# Patient Record
Sex: Male | Born: 1985
Health system: Southern US, Community
[De-identification: ages and names within clinical notes are randomized; demographics above are authoritative.]

## PROBLEM LIST (undated history)

## (undated) DIAGNOSIS — T7840XA Allergy, unspecified, initial encounter: Secondary | ICD-10-CM

## (undated) HISTORY — DX: Allergy, unspecified, initial encounter: T78.40XA

---
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
## 2009-10-19 ENCOUNTER — Ambulatory Visit: Payer: Self-pay | Admitting: Otolaryngology

## 2015-06-05 ENCOUNTER — Ambulatory Visit
Admission: RE | Admit: 2015-06-05 | Discharge: 2015-06-05 | Disposition: A | Discharge status: Home / Self Care | Payer: 59 | Source: Ambulatory Visit | Attending: Physician Assistant | Admitting: Physician Assistant

## 2015-06-05 ENCOUNTER — Ambulatory Visit (INDEPENDENT_AMBULATORY_CARE_PROVIDER_SITE_OTHER): Payer: 59 | Admitting: Physician Assistant

## 2015-06-05 ENCOUNTER — Encounter: Payer: Self-pay | Admitting: Physician Assistant

## 2015-06-05 ENCOUNTER — Telehealth: Payer: Self-pay

## 2015-06-05 VITALS — BP 102/62 | HR 72 | Temp 97.8°F | Resp 16 | Ht 73.0 in | Wt 156.4 lb

## 2015-06-05 DIAGNOSIS — S93402A Sprain of unspecified ligament of left ankle, initial encounter: Secondary | ICD-10-CM | POA: Insufficient documentation

## 2015-06-05 DIAGNOSIS — Z7189 Other specified counseling: Secondary | ICD-10-CM | POA: Diagnosis not present

## 2015-06-05 DIAGNOSIS — Y9367 Activity, basketball: Secondary | ICD-10-CM | POA: Insufficient documentation

## 2015-06-05 DIAGNOSIS — M25572 Pain in left ankle and joints of left foot: Secondary | ICD-10-CM | POA: Insufficient documentation

## 2015-06-05 DIAGNOSIS — X58XXXA Exposure to other specified factors, initial encounter: Secondary | ICD-10-CM | POA: Insufficient documentation

## 2015-06-05 DIAGNOSIS — Z7689 Persons encountering health services in other specified circumstances: Secondary | ICD-10-CM

## 2015-06-05 NOTE — Progress Notes (Signed)
Patient: Chris Sanders Male    DOB: 11/27/1985   30 y.o.   MRN: [id number] Visit Date: 06/05/2015  Today's Provider: Margaretann Loveless, PA-C   Chief Complaint  Patient presents with  . New Patient (Initial Visit)  . Ankle Injury   Subjective:    HPI Chris Sanders is here today as a new patient and c/o of ankle sprain. Ankle Pain: Patient complains of injury to the left ankle. This is evaluated as a personal injury. The injury occurred 1 day ago, and occurred while playing basketball.  The patient states the ankle rolled forward and inward in an inversion ankle injury mechanism.  He did not hear or sense a pop or snap at the time of the injury. The patient notes pain and severe swelling of the ankle since the injury. He has treated the ankle with ice once. He has sprained this ankle in the past. The Left ankle is swollen over the lateral aspect.  He cannot bear weight currently and is on crutches.      Allergies  Allergen Reactions  . Bactrim [Sulfamethoxazole-Trimethoprim]   . Sulfur    Previous Medications   No medications on file    Review of Systems  Constitutional: Negative.   HENT: Negative.   Eyes: Negative.   Respiratory: Negative.   Cardiovascular: Negative.   Endocrine: Negative.   Genitourinary: Negative.   Musculoskeletal: Positive for joint swelling (left ankle), arthralgias (left ankle) and gait problem (unable to bear weigt on left ankle currently).       Ankle Injury  Skin: Negative.   Allergic/Immunologic: Negative.   Neurological: Negative.   Hematological: Negative.   Psychiatric/Behavioral: Negative.     Social History  Substance Use Topics  . Smoking status: Current Every Day Smoker -- 0.25 packs/day    Types: Cigarettes  . Smokeless tobacco: Not on file  . Alcohol Use: Yes     Comment: 3 drinks weekly   Objective:   BP 102/62 mmHg  Pulse 72  Temp(Src) 97.8 F (36.6 C) (Oral)  Resp 16  Ht  (1.854 m)  Wt 156 lb 6.4 oz  (70.943 kg)  BMI 20.64 kg/m2  Physical Exam  Constitutional: He appears well-developed and well-nourished. No distress.  HENT:  Head: Normocephalic and atraumatic.  Cardiovascular: Normal rate, regular rhythm and normal heart sounds.  Exam reveals no gallop and no friction rub.   No murmur heard. Pulmonary/Chest: Effort normal and breath sounds normal. No respiratory distress. He has no wheezes. He has no rales.  Musculoskeletal:       Right ankle: Normal.       Left ankle: He exhibits decreased range of motion and swelling. He exhibits no ecchymosis, no deformity, no laceration and normal pulse. Tenderness. Lateral malleolus, AITFL, CF ligament and posterior TFL (mildly tender) tenderness found. No medial malleolus, no head of 5th metatarsal and no proximal fibula tenderness found. Achilles tendon normal.  Positive anterior drawer; negative talar tilt (could be false negative secondary to swelling)  Skin: He is not diaphoretic.  Vitals reviewed.       Assessment & Plan:     1. Ankle sprain, left, initial encounter Applied ace bandage to left ankle today.  Advised rest, ice, compression and elevation. May use ibuprofen or aleve for pain and inflammation. May continue crutches for now.  Will obtain xray to r/o fibular fracture/lateral malleolus fracture. No tenderness over the base of the 5th metatarsal so I do not  expect an avulsion fracture there.  Will f/u pending xray results. - DG Ankle Complete Left; Future  2. Establishing care with new doctor, encounter for No previous PCP.       Margaretann Loveless, PA-C  [facility]

## 2015-06-05 NOTE — Telephone Encounter (Signed)
-----   Message from Margaretann Loveless, PA-C sent at 06/05/2015 10:57 AM EST ----- No acute fracture noted on xray.  Soft tissue swelling consistent with grade 2 ankle sprain.  Continue RICE and non-weight bearing with crutches x 1-2 days.  Start trying to partially bear weight with assistance from crutch over the next few days. May benefit from ankle brace for stability. It may take anywhere from 2-8 weeks for full recovery. If unable to bear weight greater than 1-2 weeks please call the office. May need repeat xray secondary to swelling or referral to PT for rehab.

## 2015-06-05 NOTE — Patient Instructions (Signed)
Ankle Sprain  An ankle sprain is an injury to the strong, fibrous tissues (ligaments) that hold the bones of your ankle joint together.   CAUSES  An ankle sprain is usually caused by a fall or by twisting your ankle. Ankle sprains most commonly occur when you step on the outer edge of your foot, and your ankle turns inward. People who participate in sports are more prone to these types of injuries.   SYMPTOMS    Pain in your ankle. The pain may be present at rest or only when you are trying to stand or walk.   Swelling.   Bruising. Bruising may develop immediately or within 1 to 2 days after your injury.   Difficulty standing or walking, particularly when turning corners or changing directions.  DIAGNOSIS   Your caregiver will ask you details about your injury and perform a physical exam of your ankle to determine if you have an ankle sprain. During the physical exam, your caregiver will press on and apply pressure to specific areas of your foot and ankle. Your caregiver will try to move your ankle in certain ways. An X-ray exam may be done to be sure a bone was not broken or a ligament did not separate from one of the bones in your ankle (avulsion fracture).   TREATMENT   Certain types of braces can help stabilize your ankle. Your caregiver can make a recommendation for this. Your caregiver may recommend the use of medicine for pain. If your sprain is severe, your caregiver may refer you to a surgeon who helps to restore function to parts of your skeletal system (orthopedist) or a physical therapist.  HOME CARE INSTRUCTIONS    Apply ice to your injury for 1-2 days or as directed by your caregiver. Applying ice helps to reduce inflammation and pain.    Put ice in a plastic bag.    Place a towel between your skin and the bag.    Leave the ice on for 15-20 minutes at a time, every 2 hours while you are awake.   Only take over-the-counter or prescription medicines for pain, discomfort, or fever as directed by  your caregiver.   Elevate your injured ankle above the level of your heart as much as possible for 2-3 days.   If your caregiver recommends crutches, use them as instructed. Gradually put weight on the affected ankle. Continue to use crutches or a cane until you can walk without feeling pain in your ankle.   If you have a plaster splint, wear the splint as directed by your caregiver. Do not rest it on anything harder than a pillow for the first 24 hours. Do not put weight on it. Do not get it wet. You may take it off to take a shower or bath.   You may have been given an elastic bandage to wear around your ankle to provide support. If the elastic bandage is too tight (you have numbness or tingling in your foot or your foot becomes cold and blue), adjust the bandage to make it comfortable.   If you have an air splint, you may blow more air into it or let air out to make it more comfortable. You may take your splint off at night and before taking a shower or bath. Wiggle your toes in the splint several times per day to decrease swelling.  SEEK MEDICAL CARE IF:    You have rapidly increasing bruising or swelling.   Your toes feel   extremely cold or you lose feeling in your foot.   Your pain is not relieved with medicine.  SEEK IMMEDIATE MEDICAL CARE IF:   Your toes are numb or blue.   You have severe pain that is increasing.  MAKE SURE YOU:    Understand these instructions.   Will watch your condition.   Will get help right away if you are not doing well or get worse.     This information is not intended to replace advice given to you by your health care provider. Make sure you discuss any questions you have with your health care provider.     Document Released: 03/25/2005 Document Revised: 04/15/2014 Document Reviewed: 04/06/2011  Elsevier Interactive Patient Education 2016 Elsevier Inc.

## 2015-06-05 NOTE — Telephone Encounter (Signed)
Marvis's wife advised as directed below. She is on the emergency contact.  Thanks,  -Alexcis Bicking

## 2015-06-29 ENCOUNTER — Ambulatory Visit (INDEPENDENT_AMBULATORY_CARE_PROVIDER_SITE_OTHER): Payer: 59 | Admitting: Physician Assistant

## 2015-06-29 VITALS — Temp 97.9°F | Wt 161.6 lb

## 2015-06-29 DIAGNOSIS — Z23 Encounter for immunization: Secondary | ICD-10-CM | POA: Diagnosis not present

## 2015-06-29 NOTE — Progress Notes (Signed)
Patient here for a Tdap vaccine. His wife is pregnant. Symptoms: Negative. Tdap given today. Patient tolerated well.

## 2016-04-01 ENCOUNTER — Emergency Department (HOSPITAL_COMMUNITY)
Admission: EM | Admit: 2016-04-01 | Discharge: 2016-04-01 | Disposition: A | Discharge status: Home / Self Care | Payer: 59 | Attending: Emergency Medicine | Admitting: Emergency Medicine

## 2016-04-01 ENCOUNTER — Encounter (HOSPITAL_COMMUNITY): Payer: Self-pay | Admitting: Emergency Medicine

## 2016-04-01 DIAGNOSIS — K529 Noninfective gastroenteritis and colitis, unspecified: Secondary | ICD-10-CM | POA: Diagnosis not present

## 2016-04-01 DIAGNOSIS — K0889 Other specified disorders of teeth and supporting structures: Secondary | ICD-10-CM | POA: Diagnosis not present

## 2016-04-01 DIAGNOSIS — F17210 Nicotine dependence, cigarettes, uncomplicated: Secondary | ICD-10-CM | POA: Diagnosis not present

## 2016-04-01 DIAGNOSIS — K920 Hematemesis: Secondary | ICD-10-CM | POA: Diagnosis present

## 2016-04-01 LAB — I-STAT CHEM 8, ED
BUN: 23 mg/dL — AB (ref 6–20)
CALCIUM ION: 1.1 mmol/L — AB (ref 1.15–1.40)
CREATININE: 0.9 mg/dL (ref 0.61–1.24)
Chloride: 105 mmol/L (ref 101–111)
GLUCOSE: 91 mg/dL (ref 65–99)
HEMATOCRIT: 47 % (ref 39.0–52.0)
HEMOGLOBIN: 16 g/dL (ref 13.0–17.0)
Potassium: 4 mmol/L (ref 3.5–5.1)
Sodium: 138 mmol/L (ref 135–145)
TCO2: 22 mmol/L (ref 0–100)

## 2016-04-01 MED ORDER — ONDANSETRON 4 MG PO TBDP: ORAL_TABLET | ORAL | 0 refills | Status: DC

## 2016-04-01 MED ORDER — ONDANSETRON HCL 4 MG/2ML IJ SOLN
4.0000 mg | Freq: Once | INTRAMUSCULAR | Status: AC
Start: 2016-04-01 — End: 2016-04-01
  Administered 2016-04-01: 4 mg via INTRAVENOUS
  Filled 2016-04-01: qty 2

## 2016-04-01 MED ORDER — SODIUM CHLORIDE 0.9 % IV BOLUS (SEPSIS)
1000.0000 mL | Freq: Once | INTRAVENOUS | Status: AC
  Administered 2016-04-01: 1000 mL via INTRAVENOUS

## 2016-04-01 MED ORDER — AMOXICILLIN 500 MG PO CAPS: 500.0000 mg | ORAL_CAPSULE | Freq: Three times a day (TID) | ORAL | 0 refills | Status: DC

## 2016-04-01 NOTE — ED Notes (Signed)
Pt verbalized understanding discharge instructions and denies any further needs or questions at this time. VS stable, ambulatory and steady gait.   

## 2016-04-01 NOTE — ED Triage Notes (Signed)
Pt c/o of a moderate amount of blood in his vomit since 03-22-16 AM.  Pt has several family members who also have similar symptoms. Pt has not been able top keep down food but has been able to drink a little bit of water and keep that down.

## 2016-04-01 NOTE — ED Provider Notes (Signed)
[facility]-EMERGENCY DEPT Provider Note   CSN: [id number] Arrival date & time: 04/01/16  1429     History   Chief Complaint Chief Complaint  Patient presents with  . Hematemesis    HPI Chris Sanders is a 30 y.o. male.  Patient with no medical problems, history of cigarette smoking presents with vomiting diarrhea hematemesis. Patient's daughter and family members sick recently. Patient's eating different foods all day. No history of ulcer, no abdominal pain no blood in the stools or melena recently. Patient feels fatigued and lightheaded. The patient initially had normal expected: Vomiting after recurrent episodes of small amount of bright red blood. Patient denies regular NSAID use, ulcer history or alcohol.      Past Medical History:  Diagnosis Date  . Allergy     There are no active problems to display for this patient.   History reviewed. No pertinent surgical history.     Home Medications    Prior to Admission medications   Medication Sig Start Date End Date Taking? Authorizing Provider  ondansetron (ZOFRAN ODT) 4 MG disintegrating tablet 4mg  ODT q4 hours prn nausea/vomit 04/01/16   Blane OharaJoshua Caylynn Minchew, MD    Family History No family history on file.  Social History Social History  Substance Use Topics  . Smoking status: Current Every Day Smoker    Packs/day: 0.25    Types: Cigarettes  . Smokeless tobacco: Not on file  . Alcohol use Yes     Comment: 3 drinks weekly     Allergies   Bactrim [sulfamethoxazole-trimethoprim] and Sulfur   Review of Systems Review of Systems  Constitutional: Positive for fatigue. Negative for chills and fever.  HENT: Negative for congestion.   Eyes: Negative for visual disturbance.  Respiratory: Negative for shortness of breath.   Cardiovascular: Negative for chest pain.  Gastrointestinal: Positive for nausea and vomiting. Negative for abdominal pain.  Genitourinary: Negative for dysuria and flank pain.  Musculoskeletal:  Negative for back pain, neck pain and neck stiffness.  Skin: Negative for rash.  Neurological: Positive for light-headedness. Negative for headaches.     Physical Exam Updated Vital Signs BP 112/69   Pulse 76   Temp 97.8 F (36.6 C) (Oral)   Resp 14   Ht 6\' 1"  (1.854 m)   Wt 160 lb (72.6 kg)   SpO2 100%   BMI 21.11 kg/m   Physical Exam  Constitutional: He appears well-developed and well-nourished.  HENT:  Head: Normocephalic and atraumatic.  Dry mm  Eyes: Conjunctivae are normal.  Neck: Neck supple.  Cardiovascular: Normal rate and regular rhythm.   No murmur heard. Pulmonary/Chest: Effort normal and breath sounds normal. No respiratory distress.  Abdominal: Soft. There is no tenderness.  Musculoskeletal: He exhibits no edema.  Neurological: He is alert.  Skin: Skin is warm and dry.  Psychiatric: He has a normal mood and affect.  Nursing note and vitals reviewed.    ED Treatments / Results  Labs (all labs ordered are listed, but only abnormal results are displayed) Labs Reviewed  I-STAT CHEM 8, ED - Abnormal; Notable for the following:       Result Value   BUN 23 (*)    Calcium, Ion 1.10 (*)    All other components within normal limits    EKG  EKG Interpretation  Date/Time:  Monday April 01 2016 15:00:21 EST Ventricular Rate:  76 PR Interval:    QRS Duration: 103 QT Interval:  370 QTC Calculation: 416 R Axis:   101 Text  Interpretation:  Sinus arrhythmia Consider right atrial enlargement Borderline right axis deviation RSR' in V1 or V2, probably normal variant ST elev, probable normal early repol pattern Confirmed by Blanton Kardell MD, Jaelyn Bourgoin [phone]) on 04/01/2016 3:05:37 PM       Radiology No results found.  Procedures Procedures (including critical care time)  Medications Ordered in ED Medications  ondansetron (ZOFRAN) injection 4 mg (4 mg Intravenous Given 04/01/16 1546)  sodium chloride 0.9 % bolus 1,000 mL (0 mLs Intravenous Stopped 04/01/16  1634)     Initial Impression / Assessment and Plan / ED Course  I have reviewed the triage vital signs and the nursing notes.  Pertinent labs & imaging results that were available during my care of the patient were reviewed by me and considered in my medical decision making (see chart for details).  Clinical Course    Patient presents with typical concern for gastroenteritis.  Plan for screening blood work, IV fluids, nausea meds and likely close open follow-up. Pt improved in ED tolerated po, no pain, no fever. Results and differential diagnosis were discussed with the patient/parent/guardian. Xrays were independently reviewed by myself.  Close follow up outpatient was discussed, comfortable with the plan.   Medications  ondansetron (ZOFRAN) injection 4 mg (4 mg Intravenous Given 04/01/16 1546)  sodium chloride 0.9 % bolus 1,000 mL (0 mLs Intravenous Stopped 04/01/16 1634)    Vitals:   04/01/16 1456 04/01/16 1515 04/01/16 1520  BP: 127/79 112/69   Pulse: 90 76   Resp: 18 14   Temp: 97.8 F (36.6 C)    TempSrc: Oral    SpO2: 99% 100%   Weight: 160 lb (72.6 kg)  160 lb (72.6 kg)  Height: 6\' 1"  (1.854 m)  6\' 1"  (1.854 m)    Final diagnoses:  Hematemesis with nausea  Gastroenteritis    Final Clinical Impressions(s) / ED Diagnoses   Final diagnoses:  Hematemesis with nausea  Gastroenteritis    New Prescriptions New Prescriptions   ONDANSETRON (ZOFRAN ODT) 4 MG DISINTEGRATING TABLET    4mg  ODT q4 hours prn nausea/vomit     Blane OharaJoshua Taziyah Iannuzzi, MD 04/01/16 1641

## 2016-04-01 NOTE — Discharge Instructions (Signed)
If you were given medicines take as directed.  If you are on coumadin or contraceptives realize their levels and effectiveness is altered by many different medicines.  If you have any reaction (rash, tongues swelling, other) to the medicines stop taking and see a physician.    If your blood pressure was elevated in the ER make sure you follow up for management with a primary doctor or return for chest pain, shortness of breath or stroke symptoms.  Please follow up as directed and return to the ER or see a physician for new or worsening symptoms.  Thank you. Vitals:   04/01/16 1456 04/01/16 1520  BP: 127/79   Pulse: 90   Resp: 18   Temp: 97.8 F (36.6 C)   TempSrc: Oral   SpO2: 99%   Weight: 160 lb (72.6 kg) 160 lb (72.6 kg)  Height: 6\' 1"  (1.854 m) 6\' 1"  (1.854 m)

## 2016-04-02 MED FILL — AMOXICILLIN 500 MG CAPSULE: 500 | 7 days supply | Qty: 21 | Fill #0

## 2016-04-02 MED FILL — ONDANSETRON ODT 4 MG TABLET: 4 | 1 days supply | Qty: 4 | Fill #0

## 2017-06-12 ENCOUNTER — Ambulatory Visit: Payer: Self-pay | Admitting: Family Medicine

## 2017-06-12 VITALS — BP 124/70 | HR 75 | Temp 98.2°F | Wt 164.0 lb

## 2017-06-12 DIAGNOSIS — H9201 Otalgia, right ear: Secondary | ICD-10-CM

## 2017-06-12 DIAGNOSIS — R509 Fever, unspecified: Secondary | ICD-10-CM

## 2017-06-12 DIAGNOSIS — J029 Acute pharyngitis, unspecified: Secondary | ICD-10-CM

## 2017-06-12 LAB — POCT RAPID STREP A (OFFICE): RAPID STREP A SCREEN: NEGATIVE

## 2017-06-12 MED ORDER — DOXYCYCLINE HYCLATE 100 MG PO CAPS
100.0000 mg | ORAL_CAPSULE | Freq: Two times a day (BID) | ORAL | 0 refills | Status: DC
Start: 2017-06-12 — End: 2020-07-18

## 2017-06-12 NOTE — Progress Notes (Signed)
.   Subjective:  Chris Sanders is a 32 y.o. male who presents for evaluation of ear infection and sore throat..  Symptoms include sore throat and right ear pain .  Onset of symptoms was 2 weeks for ear pain and 1 day of sore throat that has been gradually worsening since that time. He reports that he stuck a Q-tip in his ear 2.5 weeks ago and since that time he's experienced worsening right ear pain. Treatment to date:  none.  High risk factors for influenza complications:  none.  The following portions of the patient's history were reviewed and updated as appropriate:  allergies, current medications and past medical history.  Eyes: positive for irritation, negative for irritation Ears, nose, mouth, throat, and face: positive for ear drainage and right ear pain  Respiratory: negative Cardiovascular: negative Objective:  BP 124/70   Pulse 75   Temp 98.2 F (36.8 C)   Wt 164 lb (74.4 kg)   SpO2 95%   BMI 21.64 kg/m  General appearance: alert and cooperative Eyes: conjunctivae/corneas clear. PERRL, EOM's intact. Fundi benign. Ears: Right ear TM intact. Mild erythema present.  Nose: Nares normal. Septum midline. Mucosa normal. No drainage or sinus tenderness. Throat: abnormal findings: exudates present, moderate oropharyngeal erythema and displaced uvula Assessment:  Pharyngitis, unspecified etiology Fever, unspecified fever cause - Plan: POCT rapid strep A, negative. Exam significant for likely bacterial infection. Right ear pain, mild serous drainage present.    Plan:  Cover with a broad-spectrum antibiotic. If symptoms worsen follow-up with PCP.   Meds ordered this encounter  Medications  . doxycycline (VIBRAMYCIN) 100 MG capsule    Sig: Take 1 capsule (100 mg total) by mouth 2 (two) times daily.    Dispense:  20 capsule    Refill:  0    Godfrey PickKimberly S. Tiburcio PeaHarris, MSN, FNP-C [address]  [place], [place] [id number] [phone]

## 2017-06-12 NOTE — Patient Instructions (Signed)
-  Start doxycycline 100 mg twice daily times 10 days for pharyngitis infection which is most likely strep throat.  -Continue acetaminophen and/or ibuprofen for fever management.   -Throat pain gargle with warm salt water and you may use throat lozenges any brand.   If symptoms worsen or do not improve return for care.    Pharyngitis Pharyngitis is a sore throat (pharynx). There is redness, pain, and swelling of your throat. Follow these instructions at home:  Drink enough fluids to keep your pee (urine) clear or pale yellow.  Only take medicine as told by your doctor. ? You may get sick again if you do not take medicine as told. Finish your medicines, even if you start to feel better. ? Do not take aspirin.  Rest.  Rinse your mouth (gargle) with salt water ( tsp of salt per 1 qt of water) every 1-2 hours. This will help the pain.  If you are not at risk for choking, you can suck on hard candy or sore throat lozenges. Contact a doctor if:  You have large, tender lumps on your neck.  You have a rash.  You cough up green, yellow-brown, or bloody spit. Get help right away if:  You have a stiff neck.  You drool or cannot swallow liquids.  You throw up (vomit) or are not able to keep medicine or liquids down.  You have very bad pain that does not go away with medicine.  You have problems breathing (not from a stuffy nose). This information is not intended to replace advice given to you by your health care provider. Make sure you discuss any questions you have with your health care provider. Document Released: 09/11/2007 Document Revised: 08/31/2015 Document Reviewed: 11/30/2012 Elsevier Interactive Patient Education  2017 [employer]

## 2017-06-15 ENCOUNTER — Emergency Department
Admission: EM | Admit: 2017-06-15 | Discharge: 2017-06-16 | Disposition: A | Discharge status: Home / Self Care | Payer: Managed Care, Other (non HMO) | Attending: Emergency Medicine | Admitting: Emergency Medicine

## 2017-06-15 ENCOUNTER — Encounter: Payer: Self-pay | Admitting: Emergency Medicine

## 2017-06-15 ENCOUNTER — Other Ambulatory Visit: Payer: Self-pay

## 2017-06-15 DIAGNOSIS — R21 Rash and other nonspecific skin eruption: Secondary | ICD-10-CM | POA: Insufficient documentation

## 2017-06-15 DIAGNOSIS — Z79899 Other long term (current) drug therapy: Secondary | ICD-10-CM | POA: Insufficient documentation

## 2017-06-15 DIAGNOSIS — F17210 Nicotine dependence, cigarettes, uncomplicated: Secondary | ICD-10-CM | POA: Diagnosis not present

## 2017-06-15 NOTE — ED Triage Notes (Signed)
Pt arrives ambulatory to triage with c/o possible hand, foot and mouth which his daughter was diagnosed with recently. Pt has rash noted to arms and is in NAD.

## 2017-06-15 NOTE — ED Notes (Addendum)
Pt had fever thurs and fri, dx with strep on Friday, given doxycycline. Reports right ear pain and sore throat remain. Pt presenting with hot red splotches with swelling to right knee, bilateral wrists and antecubital spaces. Pt also presenting with small red spotted rash to face (around mouth), on head. Pt also has red raised whelps across bilateral hips and buttocks  Reports daughter has suspected Hand Foot Mouth  Hx right knee injury, fluid drained occasionally by ortho. Sent picture of knee to ortho PTA and ortho recommended come to ER for R/O septic joint.

## 2017-06-15 NOTE — ED Provider Notes (Signed)
[facility] Emergency Department Provider Note  ____________________________________________  Time seen: Approximately 11:52 PM  I have reviewed the triage vital signs and the nursing notes.   HISTORY  Chief Complaint Rash    HPI Chris Sanders is a 32 y.o. male presents to the emergency department with focal areas of erythema and swelling over multiple joints including the right knee, bilateral wrists and elbows.  Patient has similar rash over his buttocks.  Patient has a history of a loculated fluid accumulation within right knee.  Patient reports that he texted a picture of his right knee to his orthopedist, who suggested a septic knee.  Patient recently started doxycycline 2 days ago by his PCP which was intended to treat both otitis media and strep pharyngitis.  Patient has had 2 days worth of doxycycline and also took amoxicillin of his own volition.  Patient reports difficulty swallowing due to pharyngitis but reports that he is managing his own secretions.  He has had diarrhea but no wheezing or cough.  No history of anaphylaxis.  Patient reports fever before starting doxycycline.  He is accompanied by his mother.   Past Medical History:  Diagnosis Date  . Allergy     There are no active problems to display for this patient.   History reviewed. No pertinent surgical history.  Prior to Admission medications   Medication Sig Start Date End Date Taking? Authorizing Provider  amoxicillin (AMOXIL) 500 MG capsule Take 1 capsule (500 mg total) by mouth 3 (three) times daily. 04/03/16   Blane OharaZavitz, Joshua, MD  doxycycline (VIBRAMYCIN) 100 MG capsule Take 1 capsule (100 mg total) by mouth 2 (two) times daily. 06/12/17   Bing NeighborsHarris, Kimberly S, FNP  ondansetron (ZOFRAN ODT) 4 MG disintegrating tablet 4mg  ODT q4 hours prn nausea/vomit Patient not taking: Reported on 06/12/2017 04/01/16   Blane OharaZavitz, Joshua, MD    Allergies Bactrim [sulfamethoxazole-trimethoprim] and  Sulfur  No family history on file.  Social History Social History   Tobacco Use  . Smoking status: Current Every Day Smoker    Packs/day: 0.25    Types: Cigarettes  . Smokeless tobacco: Never Used  Substance Use Topics  . Alcohol use: Yes    Comment: 3 drinks weekly  . Drug use: No     Review of Systems  Constitutional: Patient has had fever. Eyes: No visual changes. No discharge ENT: No upper respiratory complaints. Cardiovascular: no chest pain. Respiratory: no cough. No SOB. Gastrointestinal: No abdominal pain.  No nausea, no vomiting.  No diarrhea.  No constipation. Genitourinary: Negative for dysuria. No hematuria Musculoskeletal: Negative for musculoskeletal pain. Skin: See history.  Neurological: Negative for headaches, focal weakness or numbness.   ____________________________________________   PHYSICAL EXAM:  VITAL SIGNS: ED Triage Vitals  Enc Vitals Group     BP 06/15/17 2137 (!) 135/91     Pulse Rate 06/15/17 2137 (!) 102     Resp 06/15/17 2137 18     Temp 06/15/17 2137 98.1 F (36.7 C)     Temp Source 06/15/17 2137 Oral     SpO2 06/15/17 2137 100 %     Weight 06/15/17 2136 165 lb (74.8 kg)     Height 06/15/17 2136 6\' 1"  (1.854 m)     Head Circumference --      Peak Flow --      Pain Score 06/15/17 2136 3     Pain Loc --      Pain Edu? --  Excl. in GC? --      Constitutional: Alert and oriented. Well appearing and in no acute distress. Eyes: Conjunctivae are normal. PERRL. EOMI. Head: Atraumatic. ENT:      Ears: TMs are pearly.      Nose: No congestion/rhinnorhea.      Mouth/Throat: Mucous membranes are moist.  Neck: No stridor.  No cervical spine tenderness to palpation. Cardiovascular: Normal rate, regular rhythm. Normal S1 and S2.  Good peripheral circulation. Respiratory: Normal respiratory effort without tachypnea or retractions. Lungs CTAB. Good air entry to the bases with no decreased or absent breath sounds. Gastrointestinal:  Bowel sounds 4 quadrants. Soft and nontender to palpation. No guarding or rigidity. No palpable masses. No distention. No CVA tenderness. Musculoskeletal: Full range of motion to all extremities.  Patient has diffuse erythema over right knee, bilateral wrists and elbows.  Positive ballottement, right knee. Neurologic:  Normal speech and language. No gross focal neurologic deficits are appreciated.  Skin:  Skin is warm, dry and intact. No rash noted.  ____________________________________________   LABS (all labs ordered are listed, but only abnormal results are displayed)  Labs Reviewed  CULTURE, BLOOD (ROUTINE X 2)  CULTURE, BLOOD (ROUTINE X 2)  CBC WITH DIFFERENTIAL/PLATELET  COMPREHENSIVE METABOLIC PANEL  URINALYSIS, COMPLETE (UACMP) WITH MICROSCOPIC  LACTIC ACID, PLASMA  LACTIC ACID, PLASMA  SEDIMENTATION RATE  C-REACTIVE PROTEIN   ____________________________________________  EKG   ____________________________________________  RADIOLOGY   No results found.  ____________________________________________    PROCEDURES  Procedure(s) performed:    Procedures    Medications - No data to display   ____________________________________________   INITIAL IMPRESSION / ASSESSMENT AND PLAN / ED COURSE  Pertinent labs & imaging results that were available during my care of the patient were reviewed by me and considered in my medical decision making (see chart for details).  Review of the [place] CSRS was performed in accordance of the [facility] prior to dispensing any controlled drugs.    Assessment and plan Differential diagnosis includes reactive arthritis, septic arthritis, drug reaction, rheumatic fever and multifocal cellulitis.  Patient was transitioned to main side of the emergency department as flex transition to closing.  Dr. Corliss Parish assumed patient care.    ____________________________________________  FINAL CLINICAL IMPRESSION(S) / ED DIAGNOSES  Final  diagnoses:  None      NEW MEDICATIONS STARTED DURING THIS VISIT:  ED Discharge Orders    None          This chart was dictated using voice recognition software/Dragon. Despite best efforts to proofread, errors can occur which can change the meaning. Any change was purely unintentional.    Orvil Feil, PA-C 06/16/17 Burna Mortimer    Loleta Rose, MD 06/16/17 [phone]

## 2017-06-16 LAB — URINALYSIS, COMPLETE (UACMP) WITH MICROSCOPIC
Bacteria, UA: NONE SEEN
Bilirubin Urine: NEGATIVE
Glucose, UA: NEGATIVE mg/dL
Hgb urine dipstick: NEGATIVE
Ketones, ur: NEGATIVE mg/dL
Leukocytes, UA: NEGATIVE
Nitrite: NEGATIVE
Protein, ur: NEGATIVE mg/dL
Specific Gravity, Urine: 1.028 (ref 1.005–1.030)
Squamous Epithelial / HPF: NONE SEEN
pH: 5 (ref 5.0–8.0)

## 2017-06-16 LAB — SEDIMENTATION RATE: Sed Rate: 3 mm/hr (ref 0–15)

## 2017-06-16 LAB — CBC WITH DIFFERENTIAL/PLATELET
Basophils Absolute: 0 10*3/uL (ref 0–0.1)
Basophils Relative: 0 %
Eosinophils Absolute: 0 10*3/uL (ref 0–0.7)
Eosinophils Relative: 0 %
HCT: 46.2 % (ref 40.0–52.0)
Hemoglobin: 16.2 g/dL (ref 13.0–18.0)
Lymphocytes Relative: 22 %
Lymphs Abs: 2.4 10*3/uL (ref 1.0–3.6)
MCH: 28.7 pg (ref 26.0–34.0)
MCHC: 35 g/dL (ref 32.0–36.0)
MCV: 82 fL (ref 80.0–100.0)
Monocytes Absolute: 0.5 10*3/uL (ref 0.2–1.0)
Monocytes Relative: 5 %
Neutro Abs: 7.9 10*3/uL — ABNORMAL HIGH (ref 1.4–6.5)
Neutrophils Relative %: 73 %
Platelets: 274 10*3/uL (ref 150–440)
RBC: 5.64 MIL/uL (ref 4.40–5.90)
RDW: 13 % (ref 11.5–14.5)
WBC: 10.9 10*3/uL — ABNORMAL HIGH (ref 3.8–10.6)

## 2017-06-16 LAB — URINE DRUG SCREEN, QUALITATIVE (ARMC ONLY)
Amphetamines, Ur Screen: NOT DETECTED
Barbiturates, Ur Screen: NOT DETECTED
Benzodiazepine, Ur Scrn: NOT DETECTED
Cannabinoid 50 Ng, Ur ~~LOC~~: POSITIVE — AB
Cocaine Metabolite,Ur ~~LOC~~: NOT DETECTED
MDMA (Ecstasy)Ur Screen: NOT DETECTED
Methadone Scn, Ur: NOT DETECTED
Opiate, Ur Screen: NOT DETECTED
Phencyclidine (PCP) Ur S: NOT DETECTED
Tricyclic, Ur Screen: NOT DETECTED

## 2017-06-16 LAB — COMPREHENSIVE METABOLIC PANEL
ALT: 31 U/L (ref 17–63)
AST: 21 U/L (ref 15–41)
Albumin: 4.1 g/dL (ref 3.5–5.0)
Alkaline Phosphatase: 95 U/L (ref 38–126)
Anion gap: 12 (ref 5–15)
BUN: 23 mg/dL — ABNORMAL HIGH (ref 6–20)
CO2: 21 mmol/L — ABNORMAL LOW (ref 22–32)
Calcium: 8.6 mg/dL — ABNORMAL LOW (ref 8.9–10.3)
Chloride: 104 mmol/L (ref 101–111)
Creatinine, Ser: 0.9 mg/dL (ref 0.61–1.24)
GFR calc Af Amer: >60 mL/min (ref >60)
GFR calc non Af Amer: >60 mL/min (ref >60)
Glucose, Bld: 103 mg/dL — ABNORMAL HIGH (ref 65–99)
Potassium: 3.8 mmol/L (ref 3.5–5.1)
Sodium: 137 mmol/L (ref 135–145)
Total Bilirubin: 0.6 mg/dL (ref 0.3–1.2)
Total Protein: 7.8 g/dL (ref 6.5–8.1)

## 2017-06-16 LAB — LACTIC ACID, PLASMA: Lactic Acid, Venous: 1.1 mmol/L (ref 0.5–1.9)

## 2017-06-16 LAB — C-REACTIVE PROTEIN: CRP: 2.8 mg/dL — ABNORMAL HIGH (ref <1.0)

## 2017-06-16 MED ORDER — KETOROLAC TROMETHAMINE 30 MG/ML IJ SOLN
15.0000 mg | Freq: Once | INTRAMUSCULAR | Status: AC
  Administered 2017-06-16: 15 mg via INTRAVENOUS
  Filled 2017-06-16: qty 1

## 2017-06-16 MED ORDER — METHYLPREDNISOLONE SODIUM SUCC 125 MG IJ SOLR
INTRAMUSCULAR | Status: AC
  Administered 2017-06-16: 125 mg via INTRAVENOUS
  Filled 2017-06-16: qty 2

## 2017-06-16 MED ORDER — PREDNISONE 10 MG PO TABS: ORAL_TABLET | ORAL | 0 refills | Status: DC

## 2017-06-16 MED ORDER — DIPHENHYDRAMINE HCL 50 MG/ML IJ SOLN
25.0000 mg | INTRAMUSCULAR | Status: AC
  Administered 2017-06-16: 25 mg via INTRAVENOUS
  Filled 2017-06-16: qty 1

## 2017-06-16 MED ORDER — SODIUM CHLORIDE 0.9 % IV BOLUS (SEPSIS)
1000.0000 mL | INTRAVENOUS | Status: AC
  Administered 2017-06-16: 1000 mL via INTRAVENOUS

## 2017-06-16 MED ORDER — METHYLPREDNISOLONE SODIUM SUCC 125 MG IJ SOLR
125.0000 mg | Freq: Once | INTRAMUSCULAR | Status: AC
  Administered 2017-06-16: 125 mg via INTRAVENOUS

## 2017-06-16 NOTE — Discharge Instructions (Signed)
As we discussed, your lab work was all reassuring, and you have no evidence of septic arthritis on physical exam.  It is most likely that you are suffering from either a drug reaction to the doxycycline or you are having a nonspecific viral rash.  There is no evidence of bacterial infection at this time and we do not think your symptoms are due to gout.  We have started you on prednisone and recommend that you take it as well as over-the-counter Benadryl according to the label instructions.  Follow-up with your primary care doctor at the next available opportunity, and you may also want to consider going to see a dermatologist as soon as you can to see if they have any other additional recommendations.  Return to the emergency department if you develop new or worsening symptoms that concern you.

## 2017-06-16 NOTE — ED Notes (Signed)
Patients color returning, states is feeling better, relaxing extremities

## 2017-06-16 NOTE — ED Notes (Signed)
While obtaining blood specimens, pt became pale, diaphoretic, hypotensive, and reporting seeing spots and thought he was going to pass out. EKG obtained and vital signs. Provider was aware and at bedside. NS bolus started, pt placed in reverse trendelenburg.

## 2017-06-16 NOTE — ED Provider Notes (Signed)
----------------------------------------- 12:38 AM on 06/16/2017 -----------------------------------------  Assuming care from [facility].  In short, Chris Sanders is a 32 y.o. male with a chief complaint of rash and a constellation of other complaints.  Refer to the original H&P for additional details.  The current plan of care is to reassess after labs to determine the need for additional workup/evaluation.    ----------------------------------------- 2:49 AM on 06/16/2017 -----------------------------------------  Labs Reviewed  CBC WITH DIFFERENTIAL/PLATELET - Abnormal; Notable for the following components:      Result Value   WBC 10.9 (*)    Neutro Abs 7.9 (*)    All other components within normal limits  COMPREHENSIVE METABOLIC PANEL - Abnormal; Notable for the following components:   CO2 21 (*)    Glucose, Bld 103 (*)    BUN 23 (*)    Calcium 8.6 (*)    All other components within normal limits  URINALYSIS, COMPLETE (UACMP) WITH MICROSCOPIC - Abnormal; Notable for the following components:   Color, Urine YELLOW (*)    APPearance CLEAR (*)    All other components within normal limits  URINE DRUG SCREEN, QUALITATIVE ([facility] ONLY) - Abnormal; Notable for the following components:   Cannabinoid 50 Ng, Ur [place] POSITIVE (*)    All other components within normal limits  CULTURE, BLOOD (ROUTINE X 2)  CULTURE, BLOOD (ROUTINE X 2)  LACTIC ACID, PLASMA  SEDIMENTATION RATE  C-REACTIVE PROTEIN    I reassessed the patient after his lab work came back.  His labs were all quite reassuring including a normal sedimentation rate and no clinically significant leukocytosis.  On my evaluation he has patches of warmth and erythema, almost plaques, that are on his extremities, his back, buttocks, and over his right knee where he has a pre-existing prepatellar bursa which has in the past required aspiration.  Based on my clinical evaluation, I have no concerns for septic arthritis; the warmth  and the erythema is very superficial and while he does have some swelling of the knee, I believe it is from the bursa and not a joint effusion.  I am able to fully flex and extend his knee with no pain nor tenderness.  A septic joint or gout would cause pain with any movement, and he is completely without pain and tenderness.  His rash does not match any specific pattern such as erythema nodosum or erythema multiforme, but my best assessment is that he is experiencing a drug rash after starting the doxycycline.  I reassessed his ears and see no sign of active otitis media and I encouraged him to not take any additional antibiotics and to take the prednisone taper that I will prescribed as well as over-the-counter Benadryl according to label instructions.  I encouraged him to follow-up with his primary care provider; he may benefit from referral to rheumatology given the nature of this presentation although it does not fit exactly with an autoimmune presentation either.  I also encouraged possibly following up with dermatology since they are the rash specialist and may have additional thoughts and concerns although I do not want to wait for dermatology to start him on prednisone.    I looked in his mouth and he has no oral lesions suggestive of SJS.  While he does seem to be having a drug rash reaction, I do not believe it is life-threatening or emergent at this time and he should be appropriate for outpatient follow-up.  I discussed all of these thoughts and recommendations with him  and his mother in detail and they both understand and agree with the plan.  I gave my usual and customary return precautions.   Final diagnoses:  Rash and nonspecific skin eruption      Hinda Kehr, MD 06/16/17 [phone]

## 2017-06-16 NOTE — ED Notes (Signed)
Pt has no noted improvement to the areas of redness that are on his body.

## 2017-06-21 LAB — CULTURE, BLOOD (ROUTINE X 2)
Culture: NO GROWTH
Culture: NO GROWTH
Special Requests: ADEQUATE

## 2018-03-24 ENCOUNTER — Other Ambulatory Visit: Payer: Self-pay | Admitting: Physician Assistant

## 2018-03-24 DIAGNOSIS — Z20828 Contact with and (suspected) exposure to other viral communicable diseases: Secondary | ICD-10-CM

## 2018-03-24 MED ORDER — OSELTAMIVIR PHOSPHATE 75 MG PO CAPS: 75.0000 mg | ORAL_CAPSULE | Freq: Every day | ORAL | 0 refills | Status: AC

## 2018-09-14 ENCOUNTER — Ambulatory Visit (INDEPENDENT_AMBULATORY_CARE_PROVIDER_SITE_OTHER): Payer: Managed Care, Other (non HMO) | Admitting: Family Medicine

## 2018-09-14 ENCOUNTER — Other Ambulatory Visit: Payer: Self-pay

## 2018-09-14 ENCOUNTER — Telehealth: Payer: Self-pay | Admitting: Hematology

## 2018-09-14 DIAGNOSIS — R51 Headache: Secondary | ICD-10-CM | POA: Diagnosis not present

## 2018-09-14 DIAGNOSIS — Z20822 Contact with and (suspected) exposure to covid-19: Secondary | ICD-10-CM

## 2018-09-14 DIAGNOSIS — J029 Acute pharyngitis, unspecified: Secondary | ICD-10-CM | POA: Diagnosis not present

## 2018-09-14 DIAGNOSIS — R059 Cough, unspecified: Secondary | ICD-10-CM

## 2018-09-14 DIAGNOSIS — R519 Headache, unspecified: Secondary | ICD-10-CM

## 2018-09-14 DIAGNOSIS — R05 Cough: Secondary | ICD-10-CM

## 2018-09-14 NOTE — Telephone Encounter (Signed)
-----   Message from Virginia Crews, MD sent at 09/14/2018 11:49 AM EDT ----- Cough, headache, sore throat

## 2018-09-14 NOTE — Progress Notes (Signed)
Patient: Chris Sanders Male    DOB: 1986/03/31   33 y.o.   MRN: [id number] Visit Date: 09/14/2018  Today's Provider: Lavon Paganini, MD   Chief Complaint  Patient presents with  . URI   Subjective:    I, Chris Sanders CMA, am acting as a scribe for Lavon Paganini, MD.    Virtual Visit via Video Note  I connected with Chris Sanders on 09/14/18 at 11:20 AM EDT by a video enabled telemedicine application and verified that I am speaking with the correct person using two identifiers.  Patient location: home Provider location: [place] involved in the visit: patient, provider   I discussed the limitations of evaluation and management by telemedicine and the availability of in person appointments. The patient expressed understanding and agreed to proceed.  URI   This is a new problem. The current episode started in the past 7 days. The problem has been unchanged. Associated symptoms include coughing (dry), headaches and a sore throat. Pertinent negatives include no congestion or diarrhea. He has tried decongestant and antihistamine for the symptoms. The treatment provided mild relief.   Symptoms present for 3-4 days No known sick contacts Played disc golf on Friday and visited a friend this weekend. 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 Bactrim [Sulfamethoxazole-Trimethoprim]   . Sulfur      Current Outpatient Medications:  .  amoxicillin (AMOXIL) 500 MG capsule, Take 1 capsule (500 mg total) by mouth 3 (three) times daily., Disp: 21 capsule, Rfl: 0 .  doxycycline (VIBRAMYCIN) 100 MG capsule, Take 1 capsule (100 mg total) by mouth 2 (two) times daily., Disp: 20 capsule, Rfl: 0 .  ondansetron (ZOFRAN ODT) 4 MG disintegrating tablet,  4mg  ODT q4 hours prn nausea/vomit (Patient not taking: Reported on 09/14/2018), Disp: 4 tablet, Rfl: 0 .  predniSONE (DELTASONE) 10 MG tablet, Take 6 tabs (60 mg) PO x 3 days, then take 4 tabs (40 mg) PO x 3 days, then take 2 tabs (20 mg) PO x 3 days, then take 1 tab (10 mg) PO x 3 days, then take 1/2 tab (5 mg) PO x 4 days. (Patient not taking: Reported on 09/14/2018), Disp: 41 tablet, Rfl: 0  Review of Systems  Constitutional: Positive for fever. Negative for chills.  HENT: Positive for sore throat. Negative for congestion.   Respiratory: Positive for cough (dry).   Gastrointestinal: Negative for diarrhea.  Genitourinary: Negative.   Neurological: Positive for weakness and headaches.    Social History   Tobacco Use  . Smoking status: Current Every Day Smoker    Packs/day: 0.25    Types: Cigarettes  . Smokeless tobacco: Never Used  Substance Use Topics  . Alcohol use: Yes    Comment: 3 drinks weekly      Objective:   There were no vitals taken for this visit. There were no vitals filed for this visit.   Physical Exam      Assessment & Plan    I discussed the assessment and treatment plan with the patient. The patient was provided an opportunity to ask questions and all were answered. The patient agreed with the plan and demonstrated an understanding of the instructions.   The patient was advised to call back or seek an in-person evaluation if  the symptoms worsen or if the condition fails to improve as anticipated.  1. Cough 2. Acute nonintractable headache, unspecified headache type 3. Sore throat - symptoms concerning for possible COVID 19 infection -Discussed with patient that this could also be viral URI that is not COVID-19 - We will send for testing -Discussed symptomatic management and return precautions - Discussed need to quarantine, as well as have his household contacts quarantine at least until test is negative, or longer if he is positive    Also advised  patient that if Dr Leonia ReaderVan Eyk is his new PCP, he cannot see us for acute problems.  He needs to choose 1 primary care office and get his care there.  Did go ahead and treat for this visit, but in the future, will need to be seen by Dr Curley SpiceVan Eyk's office for his primary care and acute care needs.   Return if symptoms worsen or fail to improve.  Approximately 25 minutes was spent in discussion of which greater than 50% was consultation.   The entirety of the information documented in the History of Present Illness, Review of Systems and Physical Exam were personally obtained by me. Portions of this information were initially documented by [facility] Sanders, CMA and reviewed by me for thoroughness and accuracy.    Bacigalupo, Marzella SchleinAngela M, MD MPH [facility]

## 2018-09-14 NOTE — Telephone Encounter (Signed)
Testing ordered and scheduled per providers request.

## 2018-09-15 LAB — NOVEL CORONAVIRUS, NAA: SARS-CoV-2, NAA: NOT DETECTED

## 2018-09-16 ENCOUNTER — Telehealth: Payer: Self-pay

## 2018-09-16 ENCOUNTER — Encounter: Payer: Self-pay | Admitting: Family Medicine

## 2018-09-16 NOTE — Telephone Encounter (Signed)
Patient advised.

## 2018-09-16 NOTE — Telephone Encounter (Signed)
Patient is requesting another letter stating that he is cleared to return to work because his test results were negative. His is still having some symptoms.

## 2018-09-16 NOTE — Telephone Encounter (Signed)
Letter completed. Can leave at front desk for pick up.  Return to work 6/15 - 7 days after start of symptoms and 3 days after resolution of symptoms.

## 2019-05-12 DIAGNOSIS — R197 Diarrhea, unspecified: Secondary | ICD-10-CM | POA: Diagnosis not present

## 2019-05-12 DIAGNOSIS — R1110 Vomiting, unspecified: Secondary | ICD-10-CM | POA: Diagnosis not present

## 2020-04-11 ENCOUNTER — Telehealth: Payer: Self-pay

## 2020-04-11 NOTE — Telephone Encounter (Signed)
Copied from CRM [id number]. Topic: General - Other >> Apr 10, 2020  1:43 PM Gwenlyn Fudge wrote: Reason for CRM: Pt called and is requesting to have a letter stating that he had covid so that he can go back to work. Pt has been seen in office back in 2020. Please advise.

## 2020-04-11 NOTE — Telephone Encounter (Signed)
When he was seen in 09/2018 he had a negative COVID test. There no other records of COVID 19, so we cannot certify this with a letter. Also looks like he has new PCP listed.

## 2020-04-12 NOTE — Telephone Encounter (Signed)
Tried calling pt, but his mailbox is full.  PEC please advise pt as below when he calls back.   Thanks,   -Vernona Rieger

## 2020-07-18 ENCOUNTER — Ambulatory Visit (INDEPENDENT_AMBULATORY_CARE_PROVIDER_SITE_OTHER): Payer: BC Managed Care – PPO

## 2020-07-18 ENCOUNTER — Other Ambulatory Visit: Payer: Self-pay

## 2020-07-18 ENCOUNTER — Ambulatory Visit
Admission: EM | Admit: 2020-07-18 | Discharge: 2020-07-18 | Disposition: A | Discharge status: Home / Self Care | Payer: BC Managed Care – PPO | Attending: Family Medicine | Admitting: Family Medicine

## 2020-07-18 DIAGNOSIS — R109 Unspecified abdominal pain: Secondary | ICD-10-CM

## 2020-07-18 DIAGNOSIS — R1031 Right lower quadrant pain: Secondary | ICD-10-CM

## 2020-07-18 DIAGNOSIS — N200 Calculus of kidney: Secondary | ICD-10-CM | POA: Diagnosis not present

## 2020-07-18 DIAGNOSIS — R1110 Vomiting, unspecified: Secondary | ICD-10-CM

## 2020-07-18 LAB — URINALYSIS, COMPLETE (UACMP) WITH MICROSCOPIC
Bacteria, UA: NONE SEEN
Bilirubin Urine: NEGATIVE
Glucose, UA: NEGATIVE mg/dL
Leukocytes,Ua: NEGATIVE
Nitrite: NEGATIVE
Protein, ur: NEGATIVE mg/dL
RBC / HPF: >50 RBC/hpf (ref 0–5)
Specific Gravity, Urine: 1.02 (ref 1.005–1.030)
Squamous Epithelial / LPF: NONE SEEN (ref 0–5)
WBC, UA: NONE SEEN WBC/hpf (ref 0–5)
pH: 6 (ref 5.0–8.0)

## 2020-07-18 MED ORDER — TAMSULOSIN HCL 0.4 MG PO CAPS
0.4000 mg | ORAL_CAPSULE | Freq: Every day | ORAL | 0 refills | Status: AC
  Filled 2020-07-18: qty 30, 30d supply, fill #0

## 2020-07-18 MED ORDER — HYDROCODONE-ACETAMINOPHEN 5-325 MG PO TABS
1.0000 | ORAL_TABLET | Freq: Three times a day (TID) | ORAL | 0 refills | Status: AC | PRN
  Filled 2020-07-18 (×2): qty 10, 4d supply, fill #0

## 2020-07-18 MED ORDER — KETOROLAC TROMETHAMINE 60 MG/2ML IM SOLN
30.0000 mg | Freq: Once | INTRAMUSCULAR | Status: AC
  Administered 2020-07-18: 30 mg via INTRAMUSCULAR

## 2020-07-18 NOTE — ED Provider Notes (Signed)
[facility]    CSN: [id number] Arrival date & time: 07/18/20  0805      History   Chief Complaint Chief Complaint  Patient presents with  . Back Pain  . Groin Pain   HPI  35 year old male presents with the above complaints.  Patient reports that he had a recent GI illness (started Wed). Had nausea, vomiting, diarrhea.  Subsequently improved and seemed to be doing well on Sunday.   Last night developed right low back pain and right testicle pain. No reports of testicle swelling.  No known relieving factors. Pain remains and is 5/10 in severity. No fever. No other complaints or concerns at this time.  Past Medical History:  Diagnosis Date  . Allergy    Home Medications    Prior to Admission medications   Medication Sig Start Date End Date Taking? Authorizing Provider  HYDROcodone-acetaminophen (NORCO/VICODIN) 5-325 MG tablet Take 1 tablet by mouth every 8 (eight) hours as needed for moderate pain. 07/18/20  Yes Oland Arquette G, DO  tamsulosin (FLOMAX) 0.4 MG CAPS capsule Take 1 capsule (0.4 mg total) by mouth daily. 07/18/20  Yes Tommie Sams, DO    Family History History reviewed. No pertinent family history.  Social History Social History   Tobacco Use  . Smoking status: Current Every Day Smoker    Packs/day: 0.25    Types: Cigarettes  . Smokeless tobacco: Never Used  Substance Use Topics  . Alcohol use: Yes    Comment: 3 drinks weekly  . Drug use: No     Allergies   Bactrim [sulfamethoxazole-trimethoprim] and Elemental sulfur   Review of Systems Review of Systems  Gastrointestinal:       Recent nausea, vomiting, diarrhea.   Genitourinary:       Right testicle pain.  Musculoskeletal: Positive for back pain.   Physical Exam Triage Vital Signs ED Triage Vitals  Enc Vitals Group     BP 07/18/20 0822 136/83     Pulse Rate 07/18/20 0822 70     Resp 07/18/20 0822 18     Temp 07/18/20 0822 98.3 F (36.8 C)     Temp Source 07/18/20 0822 Oral      SpO2 07/18/20 0822 100 %     Weight 07/18/20 0821 170 lb (77.1 kg)     Height --      Head Circumference --      Peak Flow --      Pain Score 07/18/20 0821 5     Pain Loc --      Pain Edu? --      Excl. in GC? --    Updated Vital Signs BP 136/83 (BP Location: Right Arm)   Pulse 70   Temp 98.3 F (36.8 C) (Oral)   Resp 18   Wt 77.1 kg   SpO2 100%   BMI 22.43 kg/m   Visual Acuity Right Eye Distance:   Left Eye Distance:   Bilateral Distance:    Right Eye Near:   Left Eye Near:    Bilateral Near:     Physical Exam Vitals and nursing note reviewed.  Constitutional:      General: He is not in acute distress.    Appearance: He is not ill-appearing.  HENT:     Head: Normocephalic and atraumatic.  Eyes:     General:        Right eye: No discharge.        Left eye: No discharge.  Conjunctiva/sclera: Conjunctivae normal.  Pulmonary:     Effort: Pulmonary effort is normal. No respiratory distress.  Abdominal:     General: There is no distension.     Palpations: Abdomen is soft.     Tenderness: There is no abdominal tenderness.     Hernia: There is no hernia in the left inguinal area or right inguinal area.  Genitourinary:    Penis: Circumcised.      Testes:        Right: Mass or tenderness not present.        Left: Mass or tenderness not present.     Epididymis:     Right: Normal.     Left: Normal.  Neurological:     Mental Status: He is alert.  Psychiatric:        Mood and Affect: Mood normal.        Behavior: Behavior normal.     UC Treatments / Results  Labs (all labs ordered are listed, but only abnormal results are displayed) Labs Reviewed  URINALYSIS, COMPLETE (UACMP) WITH MICROSCOPIC - Abnormal; Notable for the following components:      Result Value   APPearance HAZY (*)    Hgb urine dipstick LARGE (*)    Ketones, ur TRACE (*)    All other components within normal limits    EKG   Radiology DG Abd 1 View  Result Date:  07/18/2020 CLINICAL DATA:  Pain vomiting EXAM: ABDOMEN - 1 VIEW COMPARISON:  None. FINDINGS: There is moderate stool in the colon. There is no bowel dilatation or air-fluid level to suggest bowel obstruction. No free air. Lung bases are clear. IMPRESSION: No bowel obstruction or free air.  Lung bases clear. Electronically Signed   By: Bretta Bang III M.D.   On: 07/18/2020 09:37   CT Renal Stone Study  Result Date: 07/18/2020 CLINICAL DATA:  Right lower quadrant and right flank pain EXAM: CT ABDOMEN AND PELVIS WITHOUT CONTRAST TECHNIQUE: Multidetector CT imaging of the abdomen and pelvis was performed following the standard protocol without IV contrast. COMPARISON:  None. FINDINGS: Lower chest: Lung bases are clear. No effusions. Heart is normal size. Hepatobiliary: No focal hepatic abnormality. Gallbladder unremarkable. Pancreas: No focal abnormality or ductal dilatation. Spleen: No focal abnormality.  Normal size. Adrenals/Urinary Tract: No adrenal abnormality. No focal renal abnormality. No stones or hydronephrosis. Urinary bladder is unremarkable. Stomach/Bowel: Normal appendix. Stomach, large and small bowel grossly unremarkable. Vascular/Lymphatic: No evidence of aneurysm or adenopathy. Reproductive: No visible focal abnormality. Other: No free fluid or free air. Musculoskeletal: No acute bony abnormality. IMPRESSION: No renal or ureteral stones.  No hydronephrosis. Normal appendix. No acute findings in the abdomen or pelvis. Electronically Signed   By: Charlett Nose M.D.   On: 07/18/2020 10:16    Procedures Procedures (including critical care time)  Medications Ordered in UC Medications  ketorolac (TORADOL) injection 30 mg (30 mg Intramuscular Given 07/18/20 0912)    Initial Impression / Assessment and Plan / UC Course  I have reviewed the triage vital signs and the nursing notes.  Pertinent labs & imaging results that were available during my care of the patient were reviewed by me and  considered in my medical decision making (see chart for details).    35 year old male presents with back pain/flank pain, and right testicle pain.  Hematuria noted on urinalysis.  KUB was obtained and was independently reviewed by me.  Normal KUB.  No appreciable stone.  Discussed CT imaging and patient wished  to proceed.  CT was independent reviewed by me.  There appears to be a small stone at the distal ureter just before it meets the bladder on the right side.  Treating with increase fluid intake, strain urine, Flomax, as needed Vicodin.  Supportive care.  Final Clinical Impressions(s) / UC Diagnoses   Final diagnoses:  Flank pain  Kidney stone     Discharge Instructions     Lots of fluids.  Strain urine.  Medication as prescribed.  Take care  Dr. Adriana Simas     ED Prescriptions    Medication Sig Dispense Auth. Provider   tamsulosin (FLOMAX) 0.4 MG CAPS capsule Take 1 capsule (0.4 mg total) by mouth daily. 30 capsule Kynsleigh Westendorf G, DO   HYDROcodone-acetaminophen (NORCO/VICODIN) 5-325 MG tablet Take 1 tablet by mouth every 8 (eight) hours as needed for moderate pain. 10 tablet Everlene Other G, DO     I have reviewed the PDMP during this encounter.   Tommie Sams, [place] 07/18/20 1043

## 2020-07-18 NOTE — ED Notes (Signed)
CT renal stone approved. Ref # [id number] approved 07/18/20- 02/02/21.

## 2020-07-18 NOTE — ED Triage Notes (Addendum)
Patient presents to Urgent Care with complaints of right lower back pain today and right groin pain since last night. Pt reports he had stomach bug since weds.and is has been having episodes of vomiting, diarrhea, and abdominal pain. He also reports that he has the urge to urinate but unable to go he is unsure if related to decrease in PO intake. Treating discomfort with tylenol.   Denies hematuria or fever.

## 2020-07-18 NOTE — Discharge Instructions (Signed)
Lots of fluids.  Strain urine.  Medication as prescribed.  Take care  Dr. Adriana Simas

## 2021-09-14 DIAGNOSIS — R5383 Other fatigue: Secondary | ICD-10-CM | POA: Diagnosis not present

## 2021-09-14 DIAGNOSIS — Z87891 Personal history of nicotine dependence: Secondary | ICD-10-CM | POA: Diagnosis not present

## 2021-09-14 DIAGNOSIS — Z6825 Body mass index (BMI) 25.0-25.9, adult: Secondary | ICD-10-CM | POA: Diagnosis not present

## 2021-09-14 DIAGNOSIS — Z0000 Encounter for general adult medical examination without abnormal findings: Secondary | ICD-10-CM | POA: Diagnosis not present

## 2021-09-14 DIAGNOSIS — R0683 Snoring: Secondary | ICD-10-CM | POA: Diagnosis not present

## 2021-09-14 DIAGNOSIS — Z720 Tobacco use: Secondary | ICD-10-CM | POA: Diagnosis not present

## 2021-10-14 DIAGNOSIS — G4733 Obstructive sleep apnea (adult) (pediatric): Secondary | ICD-10-CM | POA: Diagnosis not present

## 2021-11-01 IMAGING — CT CT RENAL STONE PROTOCOL
1 of 2 series · 14 of 32 positions shown, 18 images · non-contrast
Comparison: None.
COMPARISON: None.

Addendum:
CLINICAL DATA: Right lower quadrant and right flank pain

EXAM:
CT ABDOMEN AND PELVIS WITHOUT CONTRAST
TECHNIQUE: Multidetector CT imaging of the abdomen and pelvis was performed
following the standard protocol without IV contrast.

[Series 2: axial st · axial · 0.80mm/px · z∈[-956,-541]mm · 14 of 95 slices shown, 18 images]
[im 8/95  soft-tissue]
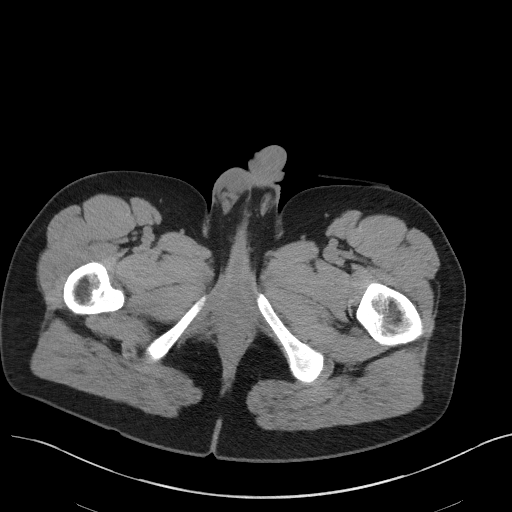
[im 8/95  bone]
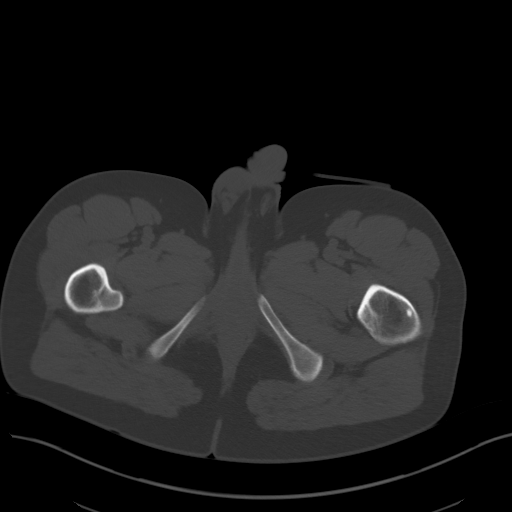
[im 16/95  soft-tissue]
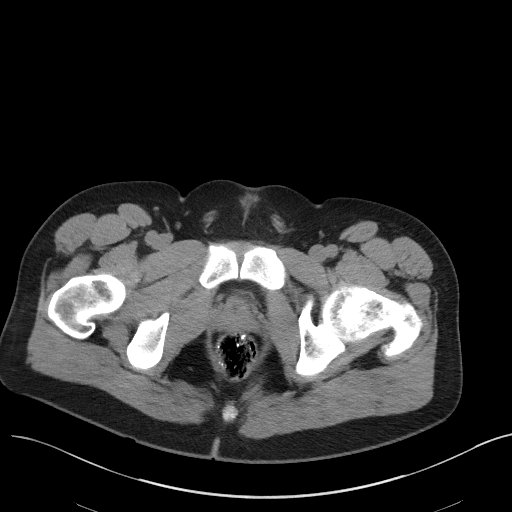
[im 23/95  soft-tissue]
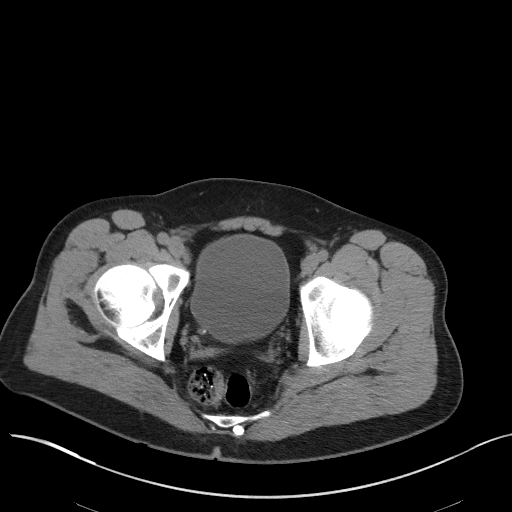
[im 31/95  soft-tissue]
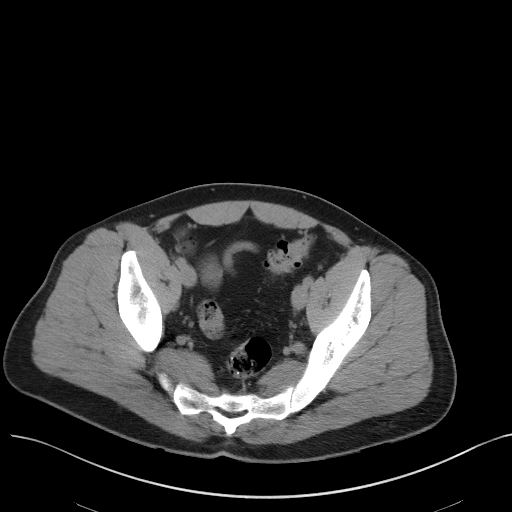
[im 38/95  soft-tissue]
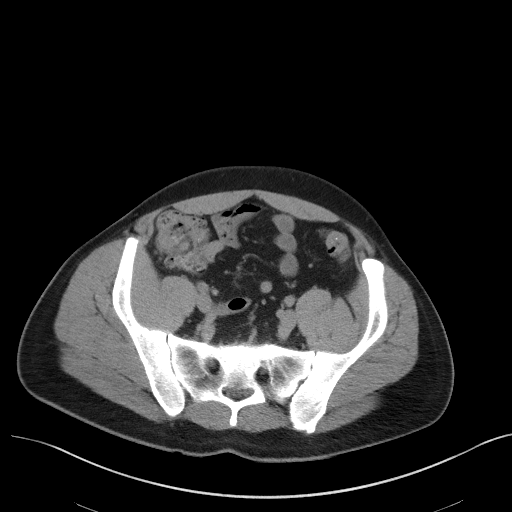
[im 46/95  soft-tissue]
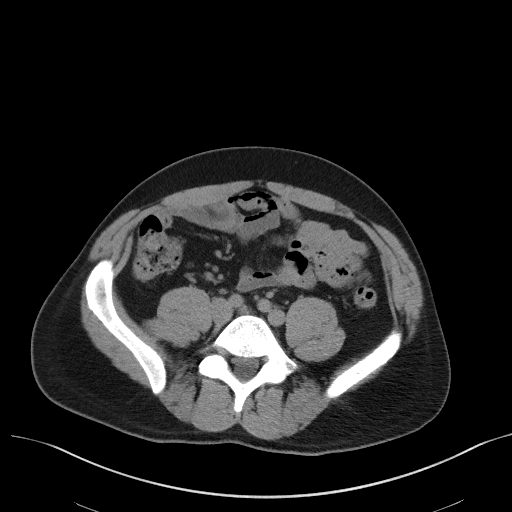
[im 53/95  soft-tissue]
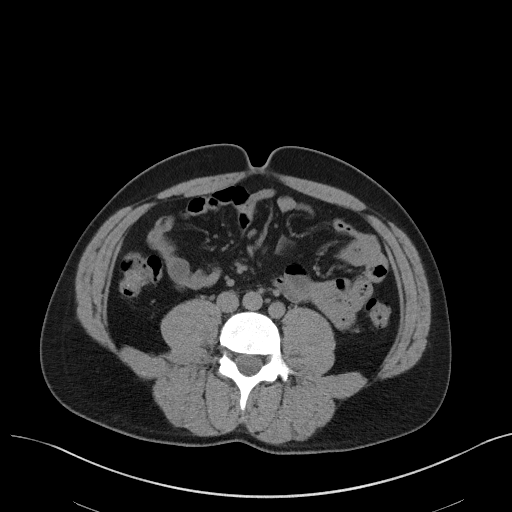
[im 61/95  soft-tissue]
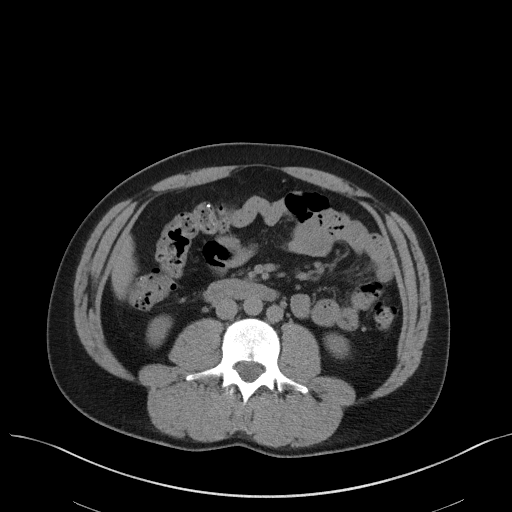
[im 68/95  soft-tissue]
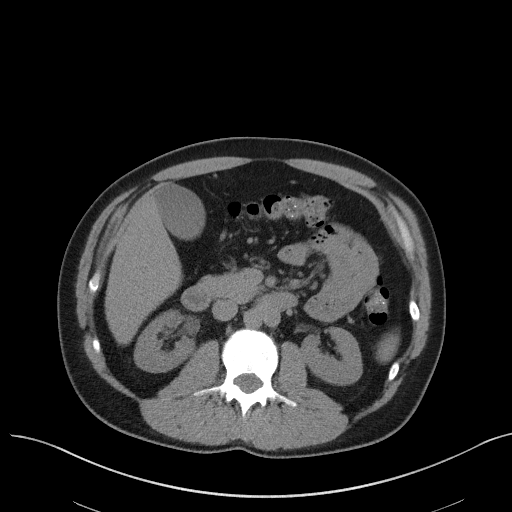
[im 68/95  bone]
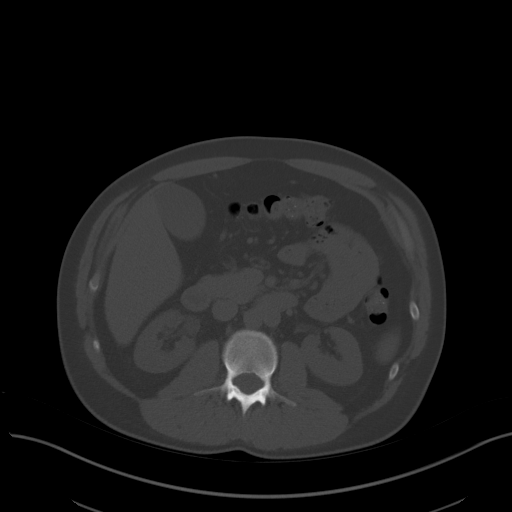
[im 76/95  soft-tissue]
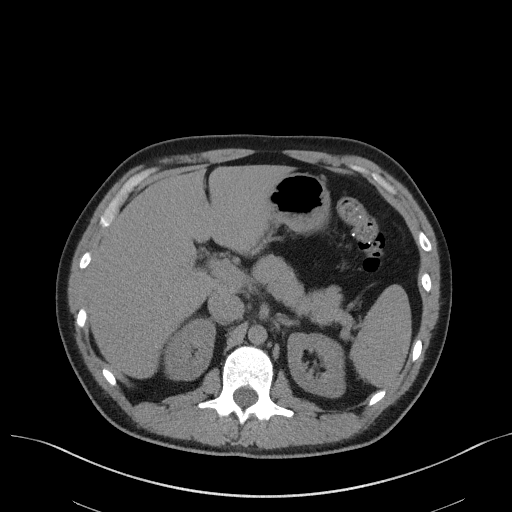
[im 79/95  lung]
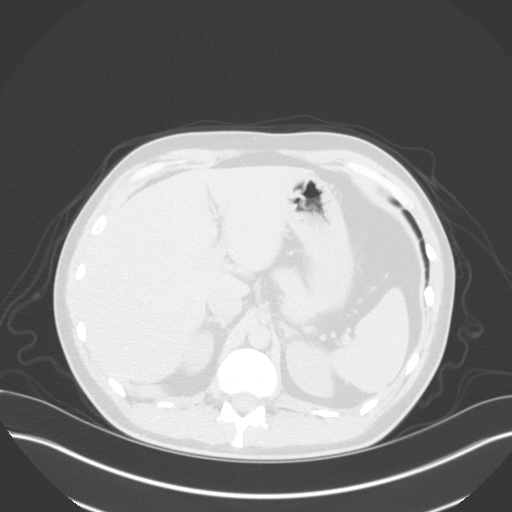
[im 83/95  soft-tissue]
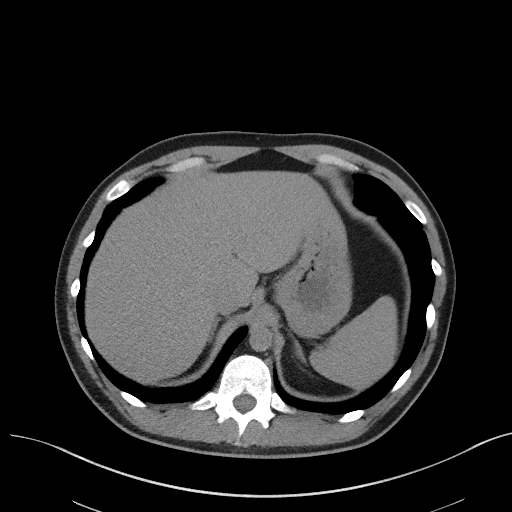
[im 83/95  lung]
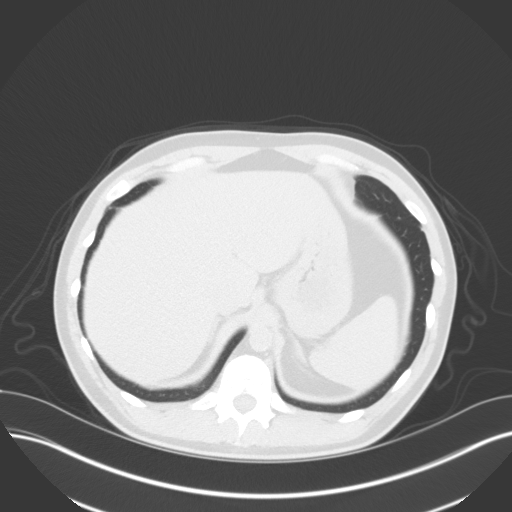
[im 87/95  lung]
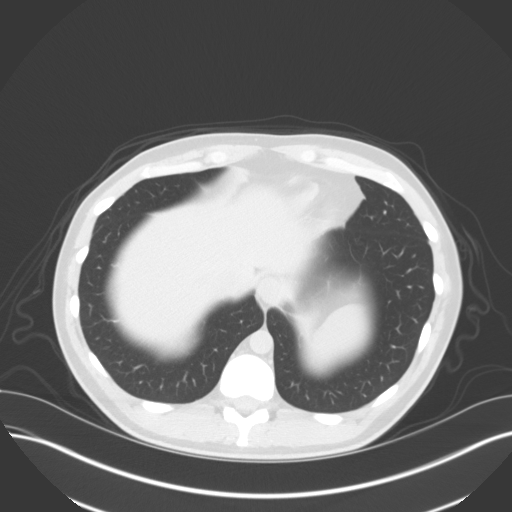
[im 91/95  soft-tissue]
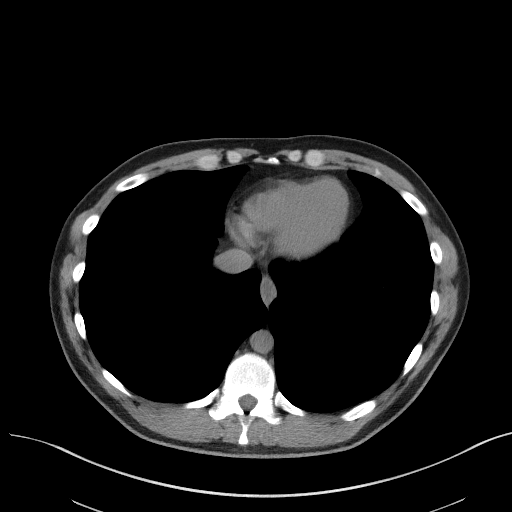
[im 91/95  lung]
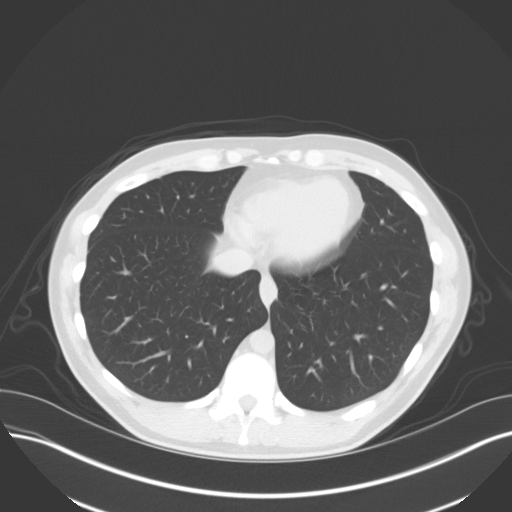

[14 of 32 positions shown; findings below may reference images not displayed]

FINDINGS: Lower chest: Lung bases are clear. No effusions. Heart is normal
size.

Hepatobiliary: No focal hepatic abnormality. Gallbladder
unremarkable.

Pancreas: No focal abnormality or ductal dilatation.

Spleen: No focal abnormality.  Normal size.

Adrenals/Urinary Tract: No adrenal abnormality. No focal renal
abnormality. No stones or hydronephrosis. Urinary bladder is
unremarkable.

Stomach/Bowel: Normal appendix. Stomach, large and small bowel
grossly unremarkable.

Vascular/Lymphatic: No evidence of aneurysm or adenopathy.

Reproductive: No visible focal abnormality.

Other: No free fluid or free air.

Musculoskeletal: No acute bony abnormality.
IMPRESSION: No renal or ureteral stones.  No hydronephrosis.

Normal appendix.

No acute findings in the abdomen or pelvis.

ADDENDUM:
2 mm right UVJ stone noted.  No hydronephrosis.
IMPRESSION: 2 mm right UVJ stone, nonobstructing.

These results were called by telephone at the time of interpretation
on 07/18/2020 at [DATE] to provider ABZHIR BENEDETTO FEDERICO , who verbally
acknowledged these results.

*** End of Addendum ***
FINDINGS: Lower chest: Lung bases are clear. No effusions. Heart is normal
size.

Hepatobiliary: No focal hepatic abnormality. Gallbladder
unremarkable.

Pancreas: No focal abnormality or ductal dilatation.

Spleen: No focal abnormality.  Normal size.

Adrenals/Urinary Tract: No adrenal abnormality. No focal renal
abnormality. No stones or hydronephrosis. Urinary bladder is
unremarkable.

Stomach/Bowel: Normal appendix. Stomach, large and small bowel
grossly unremarkable.

Vascular/Lymphatic: No evidence of aneurysm or adenopathy.

Reproductive: No visible focal abnormality.

Other: No free fluid or free air.

Musculoskeletal: No acute bony abnormality.
IMPRESSION: No renal or ureteral stones.  No hydronephrosis.

Normal appendix.

No acute findings in the abdomen or pelvis.

## 2021-12-05 DIAGNOSIS — G4733 Obstructive sleep apnea (adult) (pediatric): Secondary | ICD-10-CM | POA: Diagnosis not present

## 2022-01-05 DIAGNOSIS — G4733 Obstructive sleep apnea (adult) (pediatric): Secondary | ICD-10-CM | POA: Diagnosis not present

## 2022-02-04 DIAGNOSIS — G4733 Obstructive sleep apnea (adult) (pediatric): Secondary | ICD-10-CM | POA: Diagnosis not present

## 2022-03-07 DIAGNOSIS — G4733 Obstructive sleep apnea (adult) (pediatric): Secondary | ICD-10-CM | POA: Diagnosis not present

## 2022-04-06 DIAGNOSIS — G4733 Obstructive sleep apnea (adult) (pediatric): Secondary | ICD-10-CM | POA: Diagnosis not present

## 2022-05-07 DIAGNOSIS — G4733 Obstructive sleep apnea (adult) (pediatric): Secondary | ICD-10-CM | POA: Diagnosis not present

## 2022-06-05 DIAGNOSIS — G4733 Obstructive sleep apnea (adult) (pediatric): Secondary | ICD-10-CM | POA: Diagnosis not present

## 2022-07-04 DIAGNOSIS — G4733 Obstructive sleep apnea (adult) (pediatric): Secondary | ICD-10-CM | POA: Diagnosis not present

## 2022-08-04 DIAGNOSIS — G4733 Obstructive sleep apnea (adult) (pediatric): Secondary | ICD-10-CM | POA: Diagnosis not present

## 2022-09-03 DIAGNOSIS — G4733 Obstructive sleep apnea (adult) (pediatric): Secondary | ICD-10-CM | POA: Diagnosis not present

## 2023-04-18 ENCOUNTER — Encounter: Payer: Self-pay | Admitting: Internal Medicine

## 2023-05-02 ENCOUNTER — Encounter: Payer: Self-pay | Admitting: Internal Medicine

## 2023-05-30 ENCOUNTER — Encounter: Payer: Self-pay | Admitting: Internal Medicine

## 2023-06-06 ENCOUNTER — Encounter: Payer: Self-pay | Admitting: Internal Medicine

## 2023-07-18 ENCOUNTER — Encounter: Payer: Self-pay | Admitting: Internal Medicine
